# Patient Record
Sex: Female | Born: 1965 | Race: Black or African American | Hispanic: No | Marital: Single | Smoking: Never smoker
Health system: Southern US, Community
[De-identification: ages and names within clinical notes are randomized; demographics above are authoritative.]

## PROBLEM LIST (undated history)

## (undated) HISTORY — PX: OTHER SURGICAL HISTORY: SHX169

---

## 2014-04-17 ENCOUNTER — Emergency Department: Payer: Self-pay | Admitting: Emergency Medicine

## 2014-04-17 LAB — CBC
HCT: 34.2 % — AB (ref 35.0–47.0)
HGB: 10.9 g/dL — ABNORMAL LOW (ref 12.0–16.0)
MCH: 27.1 pg (ref 26.0–34.0)
MCHC: 31.8 g/dL — AB (ref 32.0–36.0)
MCV: 85 fL (ref 80–100)
Platelet: 242 10*3/uL (ref 150–440)
RBC: 4.02 10*6/uL (ref 3.80–5.20)
RDW: 13.1 % (ref 11.5–14.5)
WBC: 5 10*3/uL (ref 3.6–11.0)

## 2014-04-17 LAB — COMPREHENSIVE METABOLIC PANEL
Albumin: 3.5 g/dL
Alkaline Phosphatase: 75 U/L
Anion Gap: 6 — ABNORMAL LOW (ref 7–16)
BUN: 14 mg/dL
Bilirubin,Total: 0.5 mg/dL
CREATININE: 0.85 mg/dL
Calcium, Total: 8.8 mg/dL — ABNORMAL LOW
Chloride: 106 mmol/L
Co2: 28 mmol/L
EGFR (African American): >60
EGFR (Non-African Amer.): >60
GLUCOSE: 98 mg/dL
Potassium: 3.5 mmol/L
SGOT(AST): 16 U/L
SGPT (ALT): 11 U/L — ABNORMAL LOW
Sodium: 140 mmol/L
TOTAL PROTEIN: 7.6 g/dL

## 2014-04-17 LAB — TROPONIN I: Troponin-I: <0.03 ng/mL

## 2014-04-17 LAB — D-DIMER(ARMC): D-Dimer: 468 ng/ml

## 2015-01-29 ENCOUNTER — Encounter: Payer: Self-pay | Admitting: Emergency Medicine

## 2015-01-29 ENCOUNTER — Emergency Department
Admission: EM | Admit: 2015-01-29 | Discharge: 2015-01-29 | Disposition: A | Discharge status: Home / Self Care | Payer: Self-pay | Attending: Emergency Medicine | Admitting: Emergency Medicine

## 2015-01-29 DIAGNOSIS — J069 Acute upper respiratory infection, unspecified: Secondary | ICD-10-CM | POA: Insufficient documentation

## 2015-01-29 MED ORDER — FLUTICASONE PROPIONATE 50 MCG/ACT NA SUSP: 1.0000 | Freq: Every day | NASAL | Status: AC

## 2015-01-29 MED ORDER — BENZONATATE 100 MG PO CAPS: 100.0000 mg | ORAL_CAPSULE | Freq: Three times a day (TID) | ORAL | Status: AC | PRN

## 2015-01-29 NOTE — ED Provider Notes (Signed)
[facility] Emergency Department Provider Note ____________________________________________  Time seen: 1120  I have reviewed the triage vital signs and the nursing notes.  HISTORY  Chief Complaint  Facial Pain  HPI Kristine Ramirez is a 50 y.o. female presents to the ED for evaluation of cough and congestion over the last 3-4 days. She denies any interim fevers, chest pain, shortness of breath. She's been dosing BC powders intermittently for symptom relief. 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  History reviewed. No pertinent past medical history.  There are no active problems to display for this patient.  History reviewed. No pertinent past surgical history.  Current Outpatient Rx  Name  Route  Sig  Dispense  Refill  . benzonatate (TESSALON PERLES) 100 MG capsule   Oral   Take 1 capsule (100 mg total) by mouth 3 (three) times daily as needed for cough (Take 1-2 per dose).   30 capsule   0   . fluticasone (FLONASE) 50 MCG/ACT nasal spray   Each Nare   Place 1 spray into both nostrils daily.   16 g   0    Allergies Sulfa antibiotics  History reviewed. No pertinent family history.  Social History Social History  Substance Use Topics  . Smoking status: Never Smoker   . Smokeless tobacco: None  . Alcohol Use: None   Review of Systems  Constitutional: Negative for fever. Eyes: Negative for visual changes. ENT: Negative for sore throat. Sinus symptoms as above Cardiovascular: Negative for chest pain. Respiratory: Negative for shortness of breath. Gastrointestinal: Negative for abdominal pain, vomiting and diarrhea. Genitourinary: Negative for dysuria. Musculoskeletal: Negative for back pain. Skin: Negative for rash. Neurological: Negative for headaches, focal weakness or  numbness. ____________________________________________  PHYSICAL EXAM:  VITAL SIGNS: ED Triage Vitals  Enc Vitals Group     BP 01/29/15 1001 108/64 mmHg     Pulse Rate 01/29/15 1001 98     Resp 01/29/15 1001 18     Temp 01/29/15 1001 98.3 F (36.8 C)     Temp Source 01/29/15 1001 Oral     SpO2 01/29/15 1001 99 %     Weight 01/29/15 1001 230 lb (104.327 kg)     Height 01/29/15 1001 5\' 1"  (1.549 m)     Head Cir --      Peak Flow --      Pain Score 01/29/15 1002 8     Pain Loc --      Pain Edu? --      Excl. in GC? --    Constitutional: Alert and oriented. Well appearing and in no distress. Head: Normocephalic and atraumatic.      Eyes: Conjunctivae are normal. PERRL. Normal extraocular movements      Ears: Canals clear. TMs intact bilaterally.   Nose: Mild nasal turbinate enlargement. Clear rhinorrhea.   Mouth/Throat: Mucous membranes are moist.   Neck: Supple. No thyromegaly. Hematological/Lymphatic/Immunological: No cervical lymphadenopathy. Cardiovascular: Normal rate, regular rhythm.  Respiratory: Normal respiratory effort. No wheezes/rales/rhonchi. Gastrointestinal: Soft and nontender. No distention. Musculoskeletal: Nontender with normal range of motion in all extremities.  Neurologic:  Normal gait without ataxia. Normal speech and language. No gross focal neurologic deficits are appreciated. Skin:  Skin is warm, dry and intact. No rash noted. Psychiatric: Mood and affect are normal. Patient exhibits appropriate insight and judgment. ____________________________________________  INITIAL IMPRESSION / ASSESSMENT AND PLAN /  ED COURSE  Patient with symptoms consistent with a likely viral URI. She is encouraged to dose over-the-counter allergy medicine with decongestant for symptom relief. She is discharged with a perception for Flonase as a lump or overdose as 2 days as requested. ____________________________________________  FINAL CLINICAL IMPRESSION(S) / ED  DIAGNOSES  Final diagnoses:  URI (upper respiratory infection)      Lissa HoardJenise V Bacon Bannie Lobban, PA-C 01/29/15 1839  Darien Ramusavid W Kaminski, MD 01/30/15 1527

## 2015-01-29 NOTE — ED Notes (Addendum)
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

## 2015-01-29 NOTE — ED Notes (Signed)
Reports congestion, cough and facial soreness.  States her work told her she needs a work note.

## 2015-01-29 NOTE — Discharge Instructions (Signed)
Upper Respiratory Infection, Adult Most upper respiratory infections (URIs) are a viral infection of the air passages leading to the lungs. A URI affects the nose, throat, and upper air passages. The most common type of URI is nasopharyngitis and is typically referred to as "the common cold." URIs run their course and usually go away on their own. Most of the time, a URI does not require medical attention, but sometimes a bacterial infection in the upper airways can follow a viral infection. This is called a secondary infection. Sinus and middle ear infections are common types of secondary upper respiratory infections. Bacterial pneumonia can also complicate a URI. A URI can worsen asthma and chronic obstructive pulmonary disease (COPD). Sometimes, these complications can require emergency medical care and may be life threatening.  CAUSES Almost all URIs are caused by viruses. A virus is a type of germ and can spread from one person to another.  RISKS FACTORS You may be at risk for a URI if:   You smoke.   You have chronic heart or lung disease.  You have a weakened defense (immune) system.   You are very young or very old.   You have nasal allergies or asthma.  You work in crowded or poorly ventilated areas.  You work in health care facilities or schools. SIGNS AND SYMPTOMS  Symptoms typically develop 2-3 days after you come in contact with a cold virus. Most viral URIs last 7-10 days. However, viral URIs from the influenza virus (flu virus) can last 14-18 days and are typically more severe. Symptoms may include:   Runny or stuffy (congested) nose.   Sneezing.   Cough.   Sore throat.   Headache.   Fatigue.   Fever.   Loss of appetite.   Pain in your forehead, behind your eyes, and over your cheekbones (sinus pain).  Muscle aches.  DIAGNOSIS  Your health care provider may diagnose a URI by:  Physical exam.  Tests to check that your symptoms are not due to  another condition such as:  Strep throat.  Sinusitis.  Pneumonia.  Asthma. TREATMENT  A URI goes away on its own with time. It cannot be cured with medicines, but medicines may be prescribed or recommended to relieve symptoms. Medicines may help:  Reduce your fever.  Reduce your cough.  Relieve nasal congestion. HOME CARE INSTRUCTIONS   Take medicines only as directed by your health care provider.   Gargle warm saltwater or take cough drops to comfort your throat as directed by your health care provider.  Use a warm mist humidifier or inhale steam from a shower to increase air moisture. This may make it easier to breathe.  Drink enough fluid to keep your urine clear or pale yellow.   Eat soups and other clear broths and maintain good nutrition.   Rest as needed.   Return to work when your temperature has returned to normal or as your health care provider advises. You may need to stay home longer to avoid infecting others. You can also use a face mask and careful hand washing to prevent spread of the virus.  Increase the usage of your inhaler if you have asthma.   Do not use any tobacco products, including cigarettes, chewing tobacco, or electronic cigarettes. If you need help quitting, ask your health care provider. PREVENTION  The best way to protect yourself from getting a cold is to practice good hygiene.   Avoid oral or hand contact with people with cold  symptoms.   Wash your hands often if contact occurs.  There is no clear evidence that vitamin C, vitamin E, echinacea, or exercise reduces the chance of developing a cold. However, it is always recommended to get plenty of rest, exercise, and practice good nutrition.  SEEK MEDICAL CARE IF:   You are getting worse rather than better.   Your symptoms are not controlled by medicine.   You have chills.  You have worsening shortness of breath.  You have brown or red mucus.  You have yellow or brown nasal  discharge.  You have pain in your face, especially when you bend forward.  You have a fever.  You have swollen neck glands.  You have pain while swallowing.  You have white areas in the back of your throat. SEEK IMMEDIATE MEDICAL CARE IF:   You have severe or persistent:  Headache.  Ear pain.  Sinus pain.  Chest pain.  You have chronic lung disease and any of the following:  Wheezing.  Prolonged cough.  Coughing up blood.  A change in your usual mucus.  You have a stiff neck.  You have changes in your:  Vision.  Hearing.  Thinking.  Mood. MAKE SURE YOU:   Understand these instructions.  Will watch your condition.  Will get help right away if you are not doing well or get worse.   This information is not intended to replace advice given to you by your health care provider. Make sure you discuss any questions you have with your health care provider.   Document Released: 07/05/2000 Document Revised: 05/26/2014 Document Reviewed: 04/16/2013 Elsevier Interactive Patient Education 2016 [employer]  Take the prescription meds as directed. Start an OTC allergy medicine + pseudoephedrine (Allegra-D/Claritin-D/Zyrtec-D). [facility] for ongoing symptoms.

## 2016-04-20 ENCOUNTER — Emergency Department
Admission: EM | Admit: 2016-04-20 | Discharge: 2016-04-20 | Disposition: A | Discharge status: Home / Self Care | Payer: Self-pay | Attending: Emergency Medicine | Admitting: Emergency Medicine

## 2016-04-20 ENCOUNTER — Encounter: Payer: Self-pay | Admitting: Emergency Medicine

## 2016-04-20 ENCOUNTER — Emergency Department: Payer: Self-pay

## 2016-04-20 DIAGNOSIS — S29011A Strain of muscle and tendon of front wall of thorax, initial encounter: Secondary | ICD-10-CM

## 2016-04-20 DIAGNOSIS — Y929 Unspecified place or not applicable: Secondary | ICD-10-CM | POA: Insufficient documentation

## 2016-04-20 DIAGNOSIS — Y939 Activity, unspecified: Secondary | ICD-10-CM | POA: Insufficient documentation

## 2016-04-20 DIAGNOSIS — X58XXXA Exposure to other specified factors, initial encounter: Secondary | ICD-10-CM | POA: Insufficient documentation

## 2016-04-20 DIAGNOSIS — Y999 Unspecified external cause status: Secondary | ICD-10-CM | POA: Insufficient documentation

## 2016-04-20 MED ORDER — ORPHENADRINE CITRATE 30 MG/ML IJ SOLN
60.0000 mg | Freq: Two times a day (BID) | INTRAMUSCULAR | Status: DC
  Administered 2016-04-20: 60 mg via INTRAMUSCULAR

## 2016-04-20 MED ORDER — ORPHENADRINE CITRATE 30 MG/ML IJ SOLN
INTRAMUSCULAR | Status: AC
  Filled 2016-04-20: qty 2

## 2016-04-20 MED ORDER — KETOROLAC TROMETHAMINE 60 MG/2ML IM SOLN
60.0000 mg | Freq: Once | INTRAMUSCULAR | Status: AC
  Administered 2016-04-20: 60 mg via INTRAMUSCULAR
  Filled 2016-04-20: qty 2

## 2016-04-20 MED ORDER — METHOCARBAMOL 750 MG PO TABS: 1500.0000 mg | ORAL_TABLET | Freq: Four times a day (QID) | ORAL | 0 refills | Status: AC

## 2016-04-20 MED ORDER — ETODOLAC 400 MG PO TABS: 400.0000 mg | ORAL_TABLET | Freq: Two times a day (BID) | ORAL | 2 refills | Status: AC

## 2016-04-20 NOTE — ED Provider Notes (Signed)
[facility] Emergency Department Provider Note   ____________________________________________   First MD Initiated Contact with Patient 04/20/16 [phone]     (approximate)  I have reviewed the triage vital signs and the nursing notes.   HISTORY  Chief Complaint Rib Injury    HPI Kristine Ramirez is a 51 y.o. female patient complaining of right lateral rib pain for several days. Patient state pain started after transferring the patient from a chair to a bed. Patient stated pain increases with palpation and deep inspirations. Patient stated pain is not relieved out of medications.Patient rates pain as a 6/10. Patient had a pain as "achy".   History reviewed. No pertinent past medical history.  There are no active problems to display for this patient.   Past Surgical History:  Procedure Laterality Date  . cervical cancer      Prior to Admission medications   Medication Sig Start Date End Date Taking? Authorizing Provider  naproxen (NAPROSYN) 500 MG tablet Take 500 mg by mouth 2 (two) times daily with a meal.   Yes Historical Provider, MD  benzonatate (TESSALON PERLES) 100 MG capsule Take 1 capsule (100 mg total) by mouth 3 (three) times daily as needed for cough (Take 1-2 per dose). 01/29/15   Jenise V Bacon Menshew, PA-C  etodolac (LODINE) 400 MG tablet Take 1 tablet (400 mg total) by mouth 2 (two) times daily. 04/20/16 04/20/17  Joni Reiningonald K Smith, PA-C  fluticasone (FLONASE) 50 MCG/ACT nasal spray Place 1 spray into both nostrils daily. 01/29/15   Jenise V Bacon Menshew, PA-C  methocarbamol (ROBAXIN-750) 750 MG tablet Take 2 tablets (1,500 mg total) by mouth 4 (four) times daily. 04/20/16   Joni Reiningonald K Smith, PA-C    Allergies Sulfa antibiotics  No family history on file.  Social History Social History  Substance Use Topics  . Smoking status: Never Smoker  . Smokeless tobacco: Never Used  . Alcohol use No    Review of Systems Constitutional: No  fever/chills Eyes: No visual changes. ENT: No sore throat. Cardiovascular: Denies chest pain. Respiratory: Denies shortness of breath. Gastrointestinal: No abdominal pain.  No nausea, no vomiting.  No diarrhea.  No constipation. Genitourinary: Negative for dysuria. Musculoskeletal: Right lateral rib pain Skin: Negative for rash. Neurological: Negative for headaches, focal weakness or numbness. Allergic/Immunilogical: Sulfa antibiotics _________________________________________   PHYSICAL EXAM:  VITAL SIGNS: ED Triage Vitals  Enc Vitals Group     BP 04/20/16 0619 126/80     Pulse Rate 04/20/16 0619 78     Resp 04/20/16 0619 18     Temp 04/20/16 0619 98.4 F (36.9 C)     Temp Source 04/20/16 0619 Oral     SpO2 04/20/16 0619 99 %     Weight 04/20/16 0620 240 lb (108.9 kg)     Height 04/20/16 0620 5\' 1"  (1.549 m)     Head Circumference --      Peak Flow --      Pain Score 04/20/16 0619 6     Pain Loc --      Pain Edu? --      Excl. in GC? --     Constitutional: Alert and oriented. Mild distress. Morbid obesity Eyes: Conjunctivae are normal. PERRL. EOMI. Head: Atraumatic. Nose: No congestion/rhinnorhea. Mouth/Throat: Mucous membranes are moist.  Oropharynx non-erythematous. Neck: No stridor.  No cervical spine tenderness to palpation. Hematological/Lymphatic/Immunilogical: No cervical lymphadenopathy. Cardiovascular: Normal rate, regular rhythm. Grossly normal heart sounds.  Good peripheral circulation. Respiratory: No  guarding with deep inspirations and palpation right lateral rib cage..  No retractions. Lungs CTAB. Gastrointestinal: Soft and nontender. No distention. No abdominal bruits. No CVA tenderness. Musculoskeletal: No lower extremity tenderness nor edema.  No joint effusions. Neurologic:  Normal speech and language. No gross focal neurologic deficits are appreciated. No gait instability. Skin:  Skin is warm, dry and intact. No rash noted. Psychiatric: Mood and  affect are normal. Speech and behavior are normal.  ____________________________________________   LABS (all labs ordered are listed, but only abnormal results are displayed)  Labs Reviewed - No data to display ____________________________________________  EKG   ____________________________________________  RADIOLOGY  No acute findings on chest x-ray. ____________________________________________   PROCEDURES  Procedure(s) performed: None  Procedures  Critical Care performed: No  ____________________________________________   INITIAL IMPRESSION / ASSESSMENT AND PLAN / ED COURSE  Pertinent labs & imaging results that were available during my care of the patient were reviewed by me and considered in my medical decision making (see chart for details). No acute findings on chest x-ray  Right lateral chest wall strain. Patient given discharge care instructions. Patient given a work note. Patient given a prescription for Robaxin and Lodine.      ____________________________________________   FINAL CLINICAL IMPRESSION(S) / ED DIAGNOSES  Final diagnoses:  Muscle strain of chest wall, initial encounter      NEW MEDICATIONS STARTED DURING THIS VISIT:  New Prescriptions   ETODOLAC (LODINE) 400 MG TABLET    Take 1 tablet (400 mg total) by mouth 2 (two) times daily.   METHOCARBAMOL (ROBAXIN-750) 750 MG TABLET    Take 2 tablets (1,500 mg total) by mouth 4 (four) times daily.     Note:  This document was prepared using Dragon voice recognition software and may include unintentional dictation errors.    Joni Reining, PA-C 04/20/16 [id number]    Nita Sickle, MD 04/22/16 1145

## 2016-04-20 NOTE — ED Notes (Signed)
See triage note  States she developed pain under right rib area couple of days ago  Felt a funny feeling under right rib area  And pain radiates into back at times   Min relief with BC's

## 2016-04-20 NOTE — ED Triage Notes (Addendum)
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

## 2018-09-05 IMAGING — CR DG CHEST 2V
2 series · 2 of 2 positions shown · non-contrast
Comparison: None.

CLINICAL DATA: Right lower rib pain.

EXAM:
CHEST  2 VIEW

[chest pa]
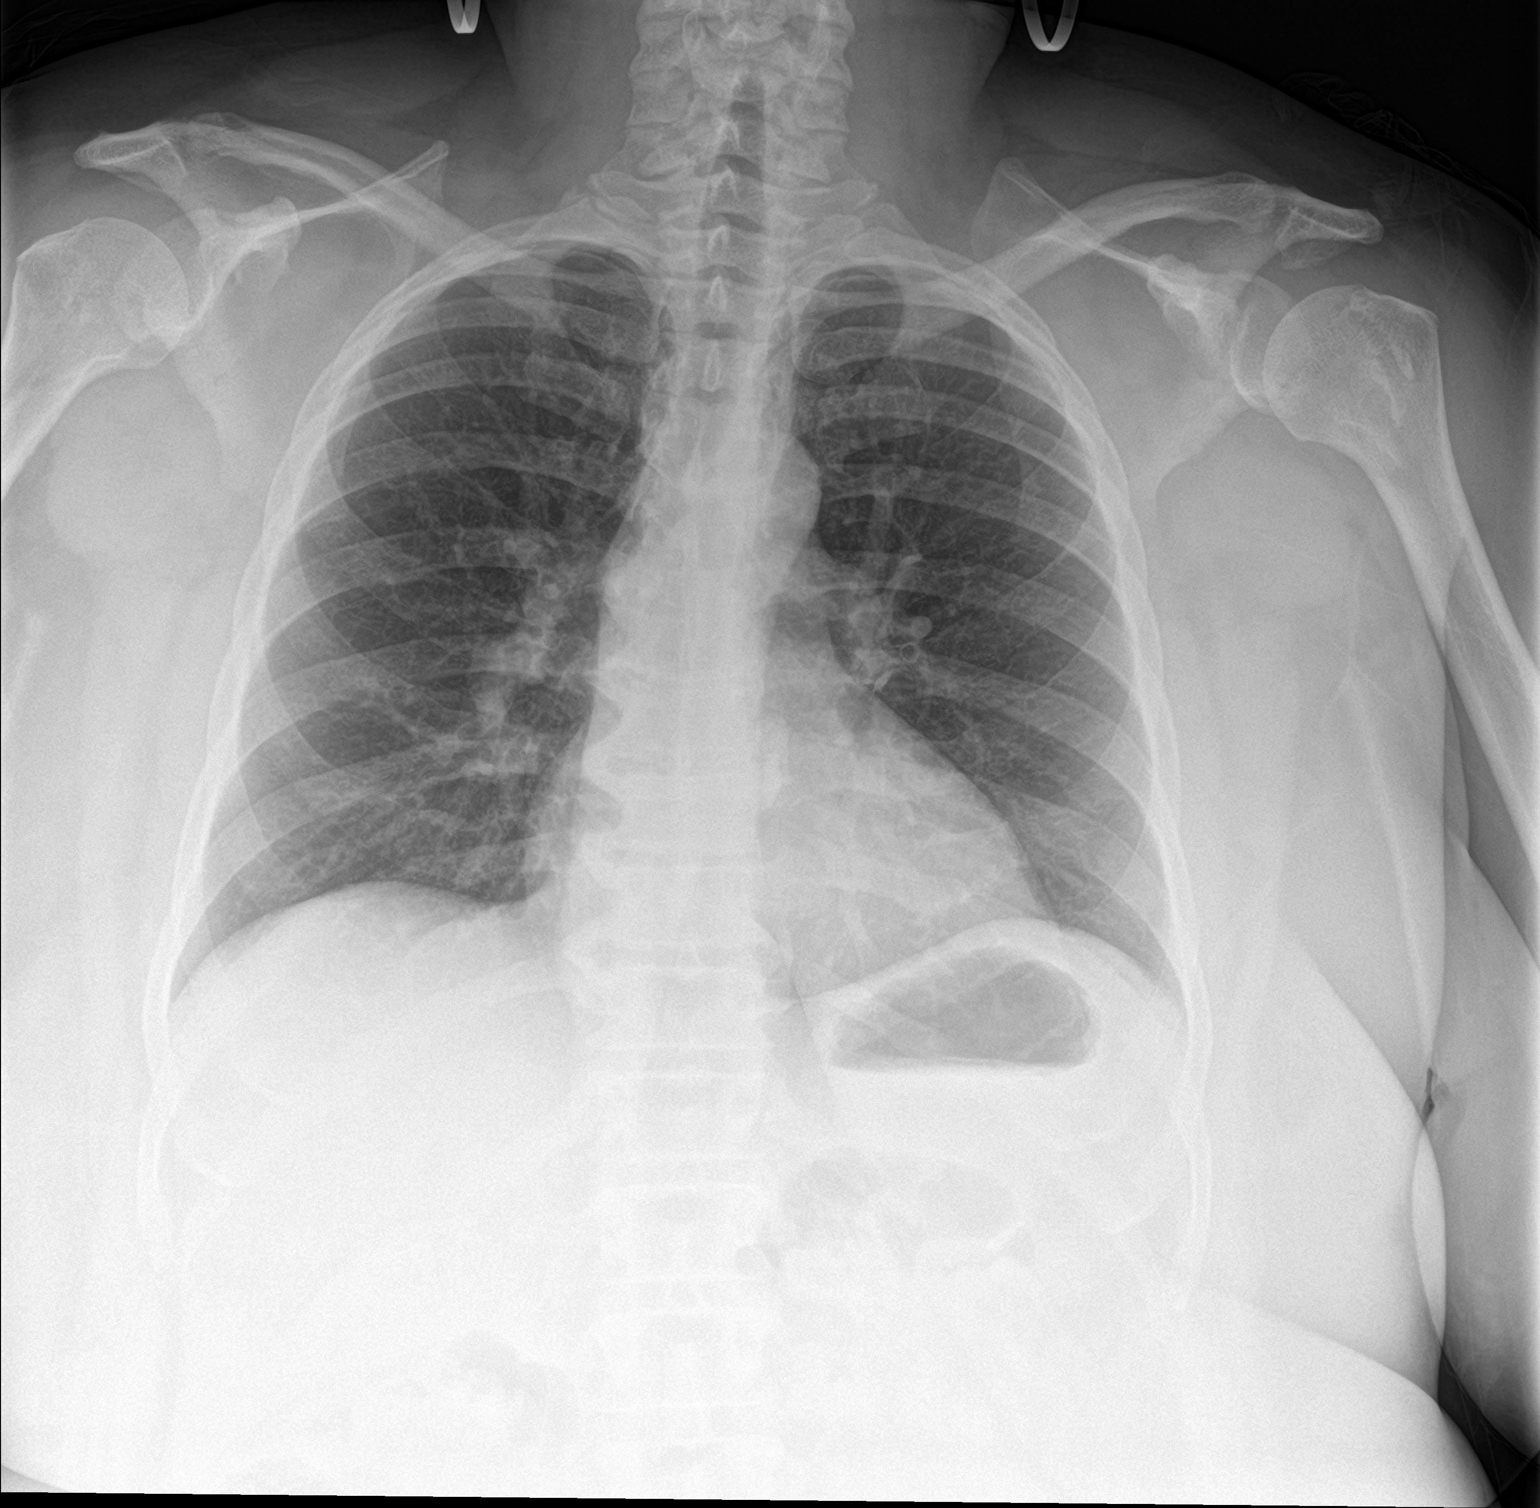

[chest lat]
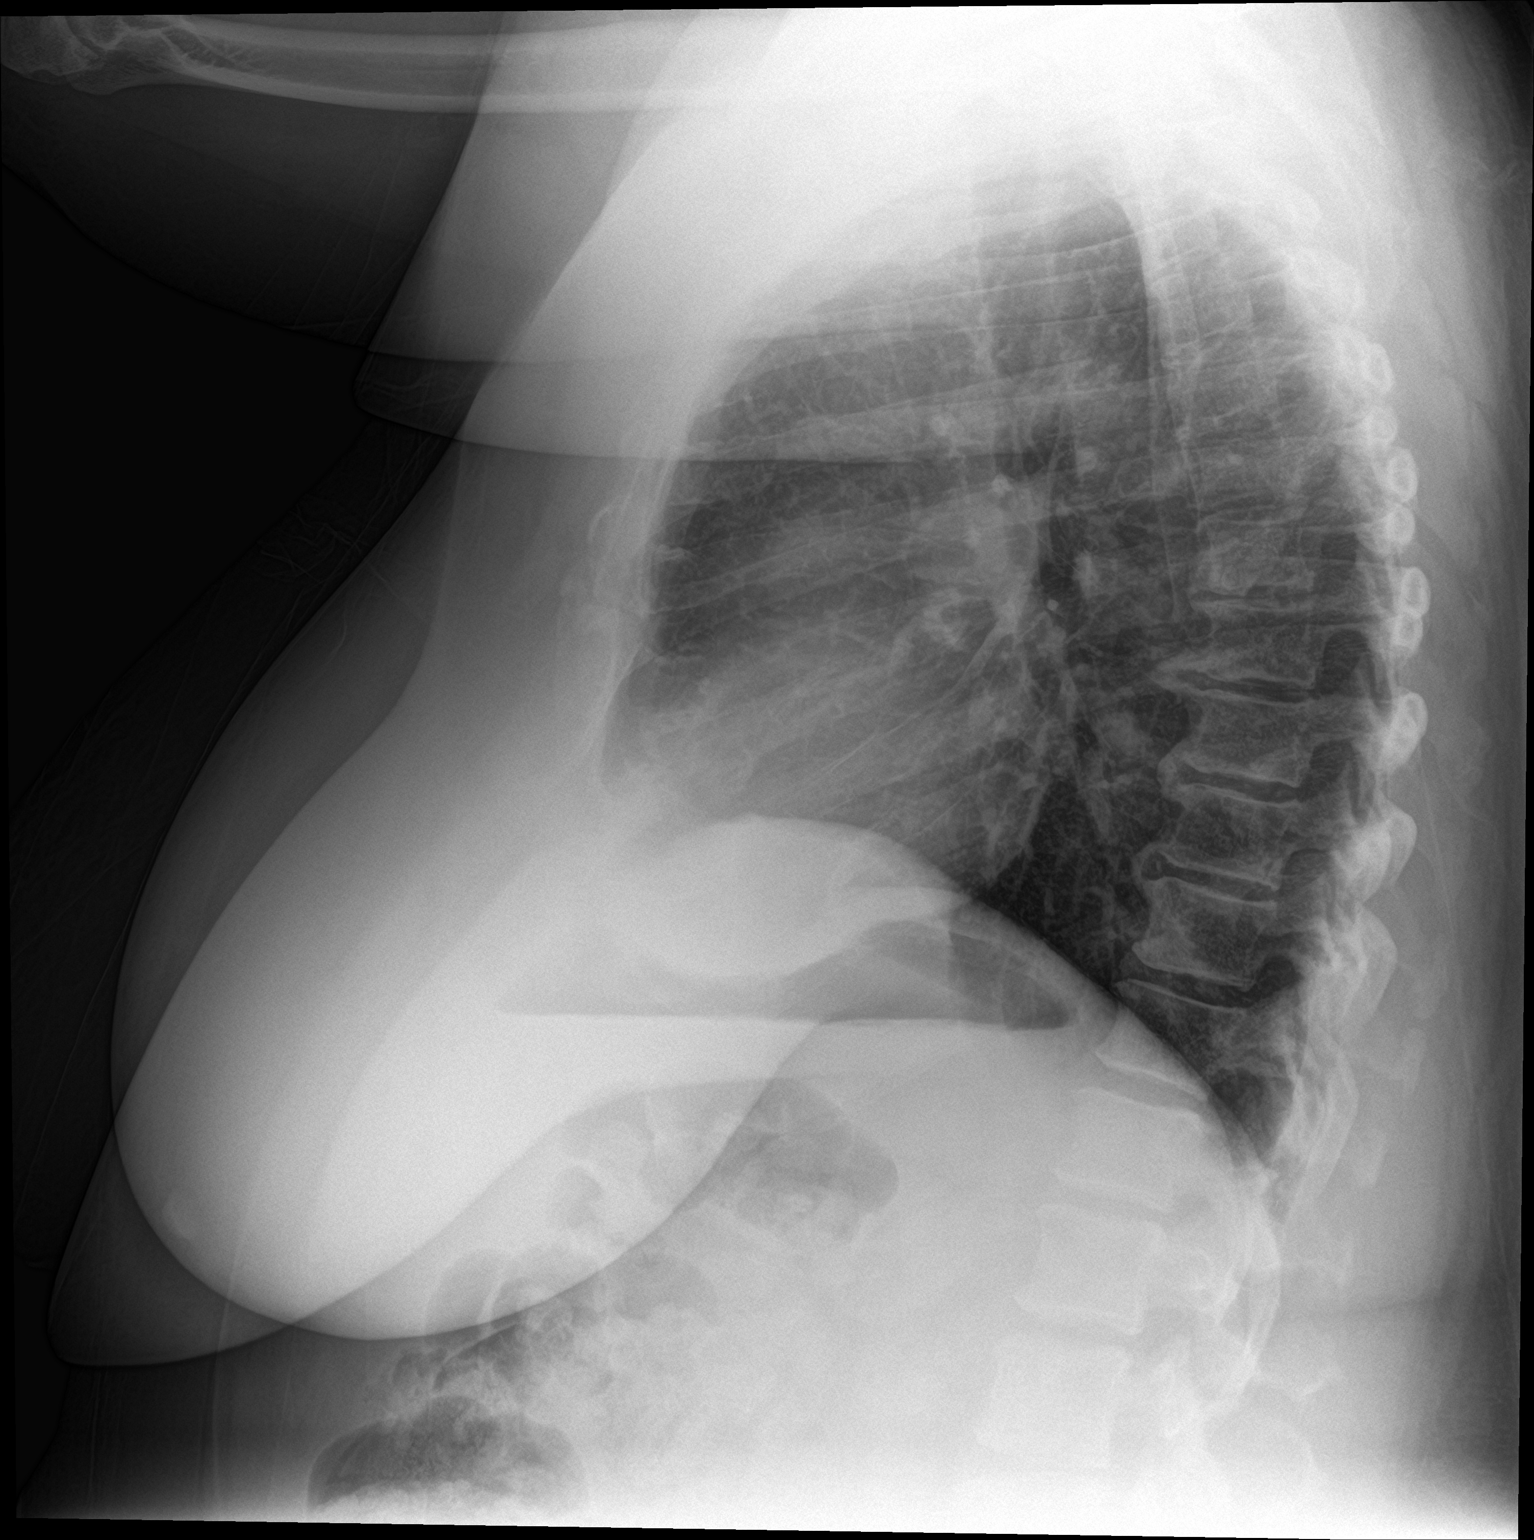

[2 of 2 positions shown; findings below may reference images not displayed]

FINDINGS: The cardiomediastinal contours are normal. The lungs are clear.
Pulmonary vasculature is normal. No consolidation, pleural effusion,
or pneumothorax. No acute osseous abnormalities are seen.
Degenerative change noted in the thoracic spine.
IMPRESSION: No acute abnormality.

## 2024-01-21 ENCOUNTER — Ambulatory Visit

## 2024-01-21 NOTE — Therapy (Incomplete)
 " OUTPATIENT PHYSICAL THERAPY KNEE EVALUATION  Patient Name: Kristine Ramirez MRN: [id number] DOB:Jul 25, 1965, 58 y.o., female Today's Date: 01/21/2024  END OF SESSION:   No past medical history on file. Past Surgical History:  Procedure Laterality Date   cervical cancer     There are no active problems to display for this patient.   PCP: Patient, No Pcp Per  REFERRING PROVIDER:  Devine, Jaimie, NP    REFERRING DIAG:  M25.561 (ICD-10-CM) - Pain in right knee  M25.562 (ICD-10-CM) - Pain in left knee  G89.29 (ICD-10-CM) - Other chronic pain    RATIONALE FOR EVALUATION AND TREATMENT: Rehabilitation  THERAPY DIAG: No diagnosis found.  ONSET DATE: ***  FOLLOW-UP APPT SCHEDULED WITH REFERRING PROVIDER: {yes/no:20286}    SUBJECTIVE:                                                                                                                                                                                         SUBJECTIVE STATEMENT:  ***  PERTINENT HISTORY: ***  PAIN:   Pain Intensity: Present: /10, Best: /10, Worst: /10 Pain location: *** Pain quality: {PAIN DESCRIPTION:21022940}  Radiating pain: {yes/no:20286}  Swelling: {yes/no:20286}  Popping, catching, locking: {yes/no:20286}  Numbness/Tingling: {yes/no:20286} Focal weakness or buckling: {yes/no:20286} Aggravating factors: *** Relieving factors: *** 24-hour pain behavior: *** How long can you sit: How long can you stand: History of prior back, hip, or knee injury, pain, surgery, or therapy: {yes/no:20286} Dominant hand: {RIGHT/LEFT:20294} Imaging: {yes/no:20286}  Prior level of function: {PLOF:24004} Occupational demands: Hobbies: Red flags: Negative for personal history of cancer, chills/fever, night sweats, nausea, vomiting, unexplained weight gain/loss, unrelenting pain  PRECAUTIONS: {Therapy precautions:24002}  WEIGHT BEARING RESTRICTIONS: {Yes ***/No:24003}  FALLS: Has patient fallen in last 6  months? {fallsyesno:27318}  Living Environment Lives with: {[facility] lives with:25569::lives with their family} Lives in: {Lives in:25570}  Patient Goals: ***  OBJECTIVE:   Patient Surveys   Extreme difficulty/unable (0), Quite a bit of difficulty (1), Moderate difficulty (2), Little difficulty (3), No difficulty (4) Survey date:    Any of your usual work, housework or school activities   2. Usual hobbies, recreational or sporting activities   3. Getting into/out of the bath   4. Walking between rooms   5. Putting on socks/shoes   6. Squatting    7. Lifting an object, like a bag of groceries from the floor   8. Performing light activities around your home   9. Performing heavy activities around your home   10. Getting into/out of a car   11. Walking 2 blocks   12. Walking 1 mile   13. Going up/down 10 stairs (  1 flight)   14. Standing for 1 hour   15.  sitting for 1 hour   16. Running on even ground   17. Running on uneven ground   18. Making sharp turns while running fast   19. Hopping    20. Rolling over in bed   Score total:  ***     Cognition Patient is oriented to person, place, and time.  Recent memory is intact.  Remote memory is intact.  Attention span and concentration are intact.  Expressive speech is intact.  Patient's fund of knowledge is within normal limits for educational level.    Gross Musculoskeletal Assessment Tremor: None Bulk: Normal Tone: Normal  GAIT: Distance walked: *** Assistive device utilized: {Assistive devices:23999} Level of assistance: {Levels of assistance:24026} Comments: ***  Posture:  AROM AROM (Normal range in degrees) AROM  Hip Right Left  Flexion (125)    Extension (15)    Abduction (40)    Adduction     Internal Rotation (45)    External Rotation (45)        Knee    Flexion (135)    Extension (0)        Ankle    Dorsiflexion (20)    Plantarflexion (50)    Inversion (35)    Eversion (15    (* = pain;  Blank rows = not tested)  LE MMT: MMT (out of 5) Right Left  Hip flexion    Hip extension    Hip abduction    Hip adduction    Hip internal rotation    Hip external rotation    Knee flexion    Knee extension    Ankle dorsiflexion    Ankle plantarflexion    Ankle inversion    Ankle eversion    (* = pain; Blank rows = not tested)  Sensation Grossly intact to light touch bilateral LEs as determined by testing dermatomes L2-S2. Proprioception, and hot/cold testing deferred on this date.  Reflexes R/L Knee Jerk (L3/4): 2+/2+  Ankle Jerk (S1/2): 2+/2+   Muscle Length Hamstrings: R: {NEGATIVE/POSITIVE QNM:80001} L: {NEGATIVE/POSITIVE QNM:80001}  Palpation Location LEFT  RIGHT           Quadriceps    Medial Hamstrings    Lateral Hamstrings    Lateral Hamstring tendon    Medial Hamstring tendon    Quadriceps tendon    Patella    Patellar Tendon    Tibial Tuberosity    Medial joint line    Lateral joint line    MCL    LCL    Adductor Tubercle    Pes Anserine tendon    Infrapatellar fat pad    Fibular head    Popliteal fossa    (Blank rows = not tested) Graded on 0-4 scale (0 = no pain, 1 = pain, 2 = pain with wincing/grimacing/flinching, 3 = pain with withdrawal, 4 = unwilling to allow palpation), (Blank rows = not tested)  Passive Accessory Motion (Knee): Deferred  SPECIAL TESTS  Ligamentous Stability  ACL: Lachman's: R: {NEGATIVE/POSITIVE QNM:80001} L: {NEGATIVE/POSITIVE QNM:80001}  PCL: Posterior Drawer: R: {NEGATIVE/POSITIVE QNM:80001} L: {NEGATIVE/POSITIVE QNM:80001}  MCL: Valgus Stress (30 degrees flexion): R: {NEGATIVE/POSITIVE QNM:80001} L: {NEGATIVE/POSITIVE QNM:80001}  LCL: Varus Stress (30 degrees flexion): R: {NEGATIVE/POSITIVE QNM:80001} L: {NEGATIVE/POSITIVE QNM:80001}  Meniscus Tests McMurray's Test:  Medial Meniscus (Tibial ER): R: {NEGATIVE/POSITIVE QNM:80001} L: {NEGATIVE/POSITIVE QNM:80001} Lateral Meniscus (Tibial IR): R:  {NEGATIVE/POSITIVE QNM:80001} L: {NEGATIVE/POSITIVE QNM:80001}  Thessaly: R: {NEGATIVE/POSITIVE QNM:80001} L: {NEGATIVE/POSITIVE QNM:80001}  Ege's: R: {NEGATIVE/POSITIVE [id number] L: {NEGATIVE/POSITIVE Q4687305 Apley's: R: {NEGATIVE/POSITIVE QNM:80001} L: {NEGATIVE/POSITIVE QNM:80001} Steinmann Sign I: R: {NEGATIVE/POSITIVE QNM:80001} L: {NEGATIVE/POSITIVE QNM:80001} Steinmann Sign II: R: {NEGATIVE/POSITIVE QNM:80001} L: {NEGATIVE/POSITIVE QNM:80001} Axial Pivot-Shift: R: {NEGATIVE/POSITIVE QNM:80001} L: {NEGATIVE/POSITIVE QNM:80001} Dynamic Knee Test: R: {NEGATIVE/POSITIVE QNM:80001} L: {NEGATIVE/POSITIVE QNM:80001} Duck Waddle Test: R: {NEGATIVE/POSITIVE QNM:80001} L: {NEGATIVE/POSITIVE QNM:80001} Effusion: R: {NEGATIVE/POSITIVE QNM:80001} L: {NEGATIVE/POSITIVE QNM:80001}   Beighton Scale  LEFT  RIGHT           1. Passive dorsiflexion and hyperextension of the fifth MCP joint beyond 90  0 0   2. Passive apposition of the thumb to the flexor aspect of the forearm  0  0   3. Passive hyperextension of the elbow beyond 10  0  0   4. Passive hyperextension of the knee beyond 10  0  0   5. Active forward flexion of the trunk with the knees fully extended so that the palms of the hands rest flat on the floor   0   TOTAL         0/ 9    [place] Knee Rules for Acute Knee injury  Yes/No         1. Aged 55 years or over {yes/no:20286}   2. Tenderness at the head of the fibula {yes/no:20286}   3. Isolated tenderness of the patella  {yes/no:20286}   4. Inability to flex knee to 90 degrees {yes/no:20286}   5. Inability to bear weight (defined as an inability to take 4 steps, ie two steps on each leg, regardless of limping) immediately and at presentation {yes/no:20286}     TODAY'S TREATMENT: DATE: 01/21/2024   PATIENT EDUCATION:  Education details: *** Person educated: {Person educated:25204} Education method: {Education Method:25205} Education comprehension: {Education  Comprehension:25206}   HOME EXERCISE PROGRAM:    ASSESSMENT:  CLINICAL IMPRESSION: Patient is a *** y.o. *** who was seen today for physical therapy evaluation and treatment for ***.   OBJECTIVE IMPAIRMENTS: {opptimpairments:25111}.   ACTIVITY LIMITATIONS: {activitylimitations:27494}  PARTICIPATION LIMITATIONS: {participationrestrictions:25113}  PERSONAL FACTORS: {Personal factors:25162} are also affecting patient's functional outcome.   REHAB POTENTIAL: {rehabpotential:25112}  CLINICAL DECISION MAKING: {clinical decision making:25114}  EVALUATION COMPLEXITY: {Evaluation complexity:25115}   GOALS: Goals reviewed with patient? Yes  SHORT TERM GOALS: Target date: {follow up:25551}  Pt will be independent with HEP to improve strength and decrease knee pain to improve pain-free function at home and work. Baseline: *** Goal status: INITIAL   LONG TERM GOALS: Target date: {follow up:25551}  Pt will increase R/L Knee AROM from *** to *** in order to demonstrate clinically significant improvements in AROM for functional tasks (squats, kneeling, stairs).  Baseline:  Goal status: INITIAL  2.  Pt will decrease worst knee pain by at least 3 points on the NPRS in order to demonstrate clinically significant reduction in knee pain. Baseline: *** Goal status: INITIAL  3.  Pt will decrease LEFS score by at least 9 points in order demonstrate clinically significant reduction in knee pain/disability.       Baseline: *** Goal status: INITIAL  4.  Pt will increase strength of *** by at least 1/2 MMT grade in order to demonstrate improvement in strength and function  Baseline: *** Goal status: INITIAL   PLAN: PT FREQUENCY: 1-2x/week  PT DURATION: {rehab duration:25117}  PLANNED INTERVENTIONS: Therapeutic exercises, Therapeutic activity, Neuromuscular re-education, Balance training, Gait training, Patient/Family education, Self Care, Joint mobilization, Joint manipulation,  Vestibular training, Canalith repositioning, Orthotic/Fit training, DME instructions, Dry Needling, Electrical stimulation,  Spinal manipulation, Spinal mobilization, Cryotherapy, Moist heat, Taping, Traction, Ultrasound, Ionotophoresis 4mg /ml Dexamethasone, Manual therapy, and Re-evaluation.  PLAN FOR NEXT SESSION: ***   Lonni Pall PT, DPT Physical Therapist- [place]  01/21/2024, 8:10 AM  "

## 2024-01-23 ENCOUNTER — Ambulatory Visit
